# Patient Record
Sex: Female | Born: 1995 | Race: White | Hispanic: No | Marital: Single | Smoking: Never smoker
Health system: Southern US, Community
[De-identification: ages and names within clinical notes are randomized; demographics above are authoritative.]

## PROBLEM LIST (undated history)

## (undated) ENCOUNTER — Inpatient Hospital Stay (HOSPITAL_COMMUNITY): Admission: AD | Payer: Self-pay | Source: Ambulatory Visit | Admitting: Obstetrics & Gynecology

## (undated) DIAGNOSIS — R634 Abnormal weight loss: Secondary | ICD-10-CM

## (undated) DIAGNOSIS — R197 Diarrhea, unspecified: Secondary | ICD-10-CM

## (undated) HISTORY — PX: NO PAST SURGERIES: SHX2092

## (undated) HISTORY — DX: Abnormal weight loss: R63.4

## (undated) HISTORY — DX: Diarrhea, unspecified: R19.7

---
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
## 2014-01-08 IMAGING — US US ABDOMEN COMPLETE
1 series · 14 of 25 positions shown · non-contrast
Comparison: None.

CLINICAL DATA: Diarrhea and weight loss.

COMPLETE ABDOMINAL ULTRASOUND

[Series 1: us abdomen complete · 0.28mm/px · 14 of 72 slices shown]
[im 1/72]
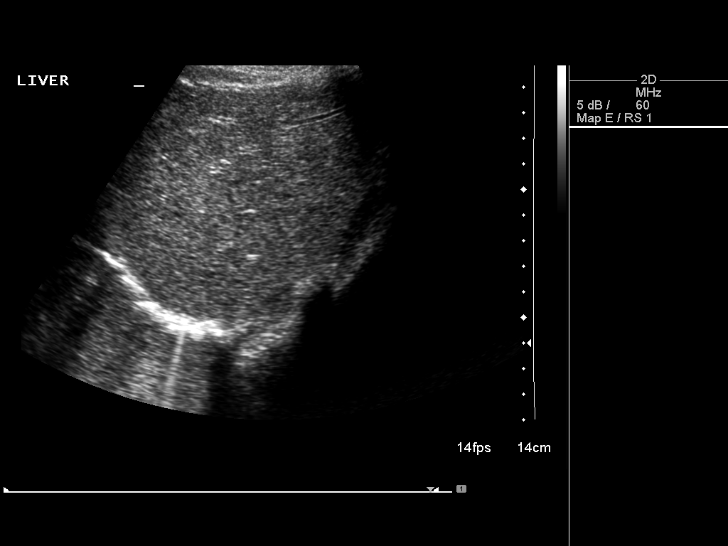
[im 6/72]
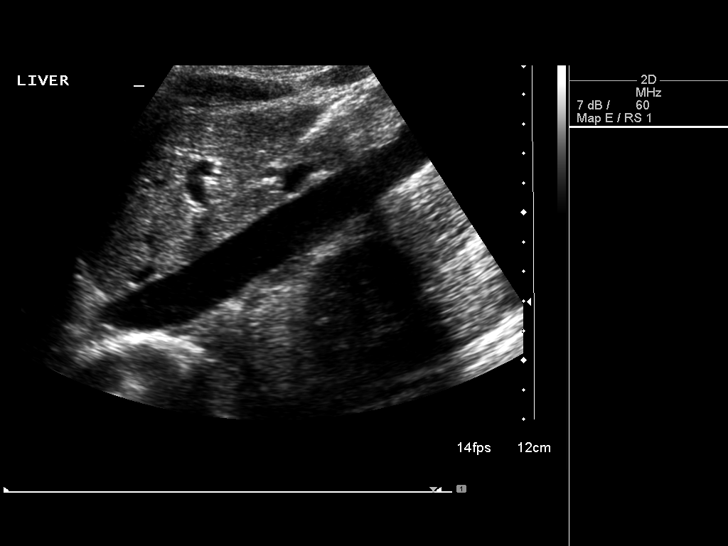
[im 12/72]
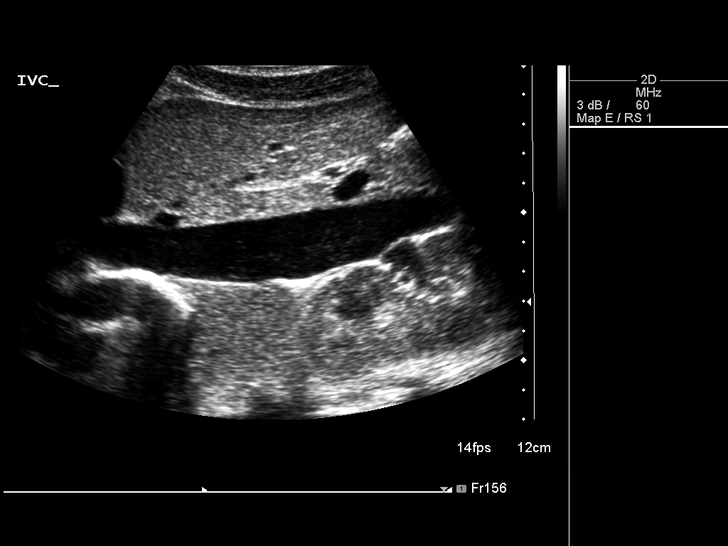
[im 18/72]
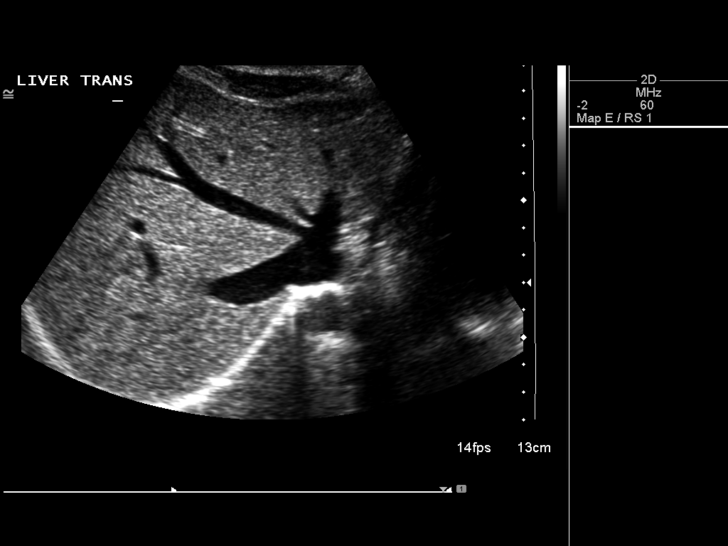
[im 24/72]
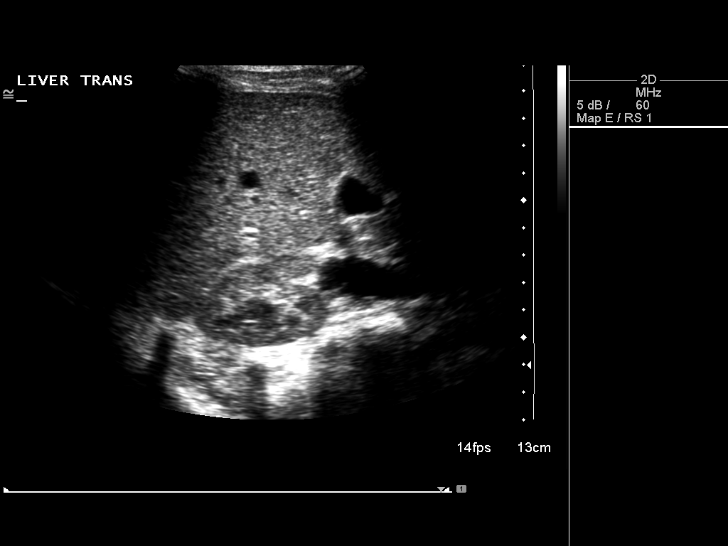
[im 27/72]
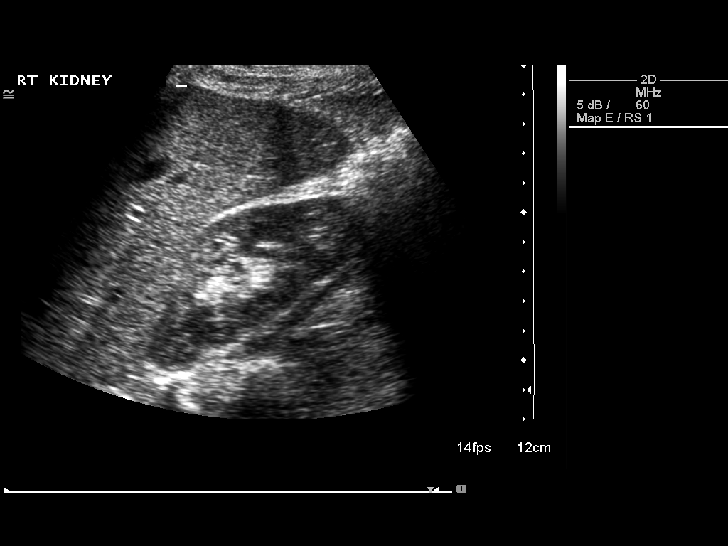
[im 33/72]
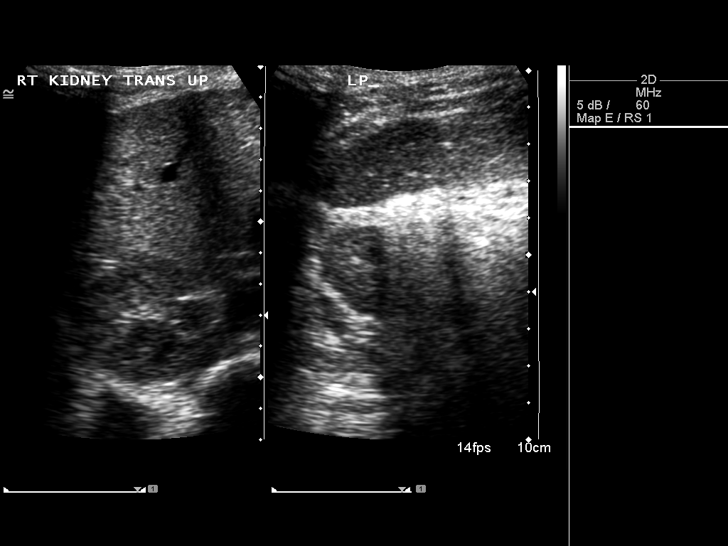
[im 39/72]
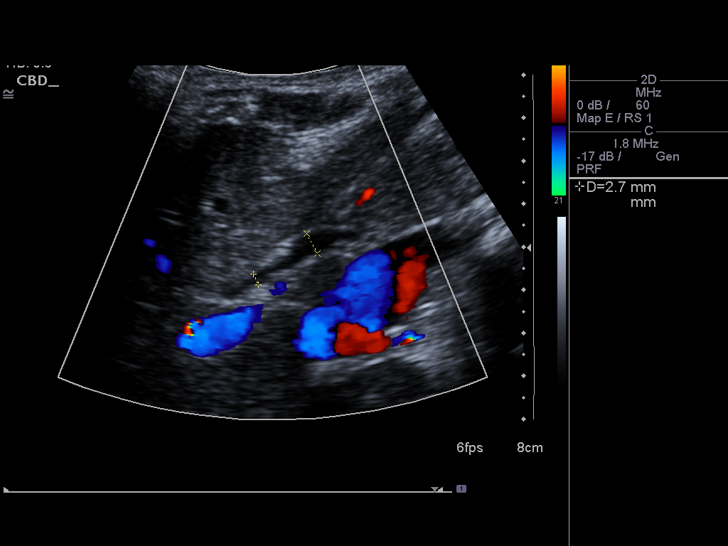
[im 45/72]
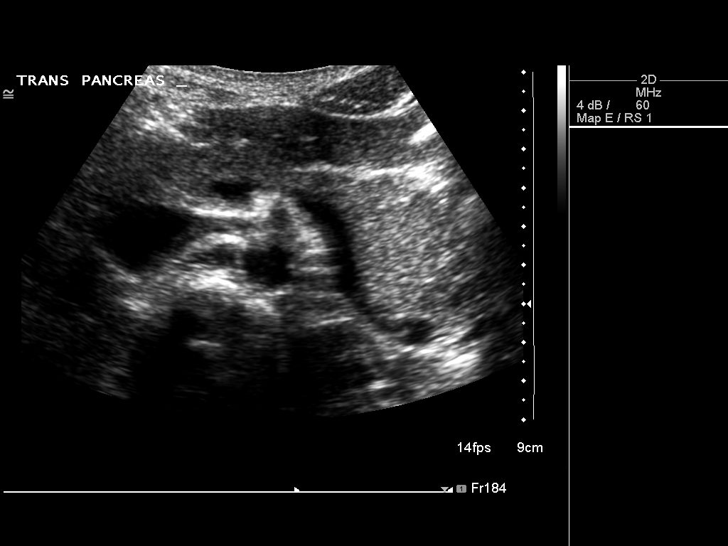
[im 48/72]
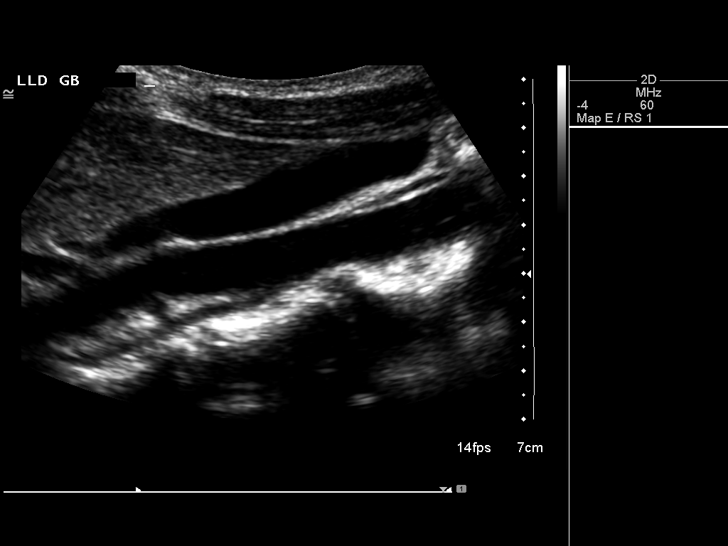
[im 54/72]
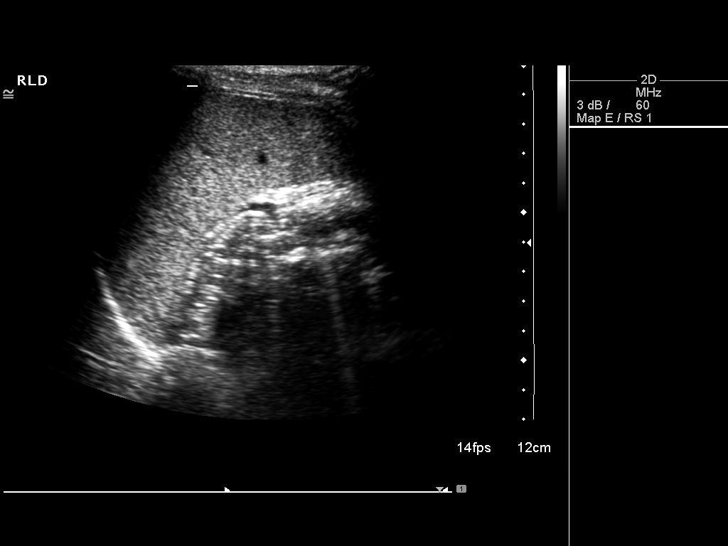
[im 60/72]
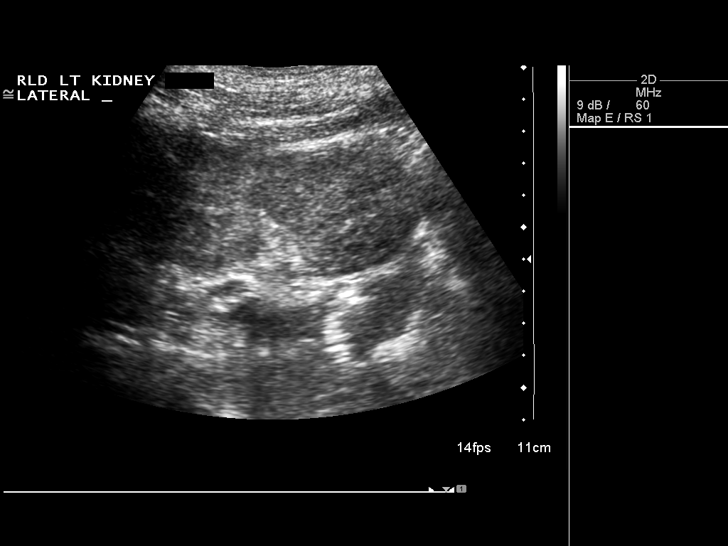
[im 66/72]
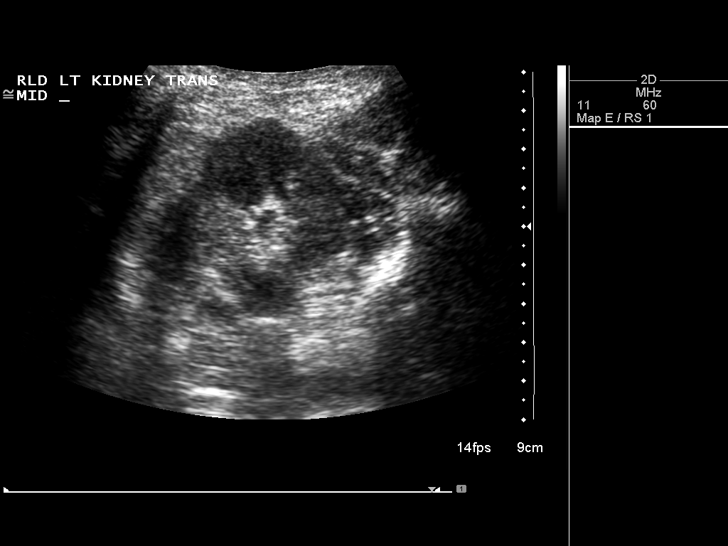
[im 72/72]
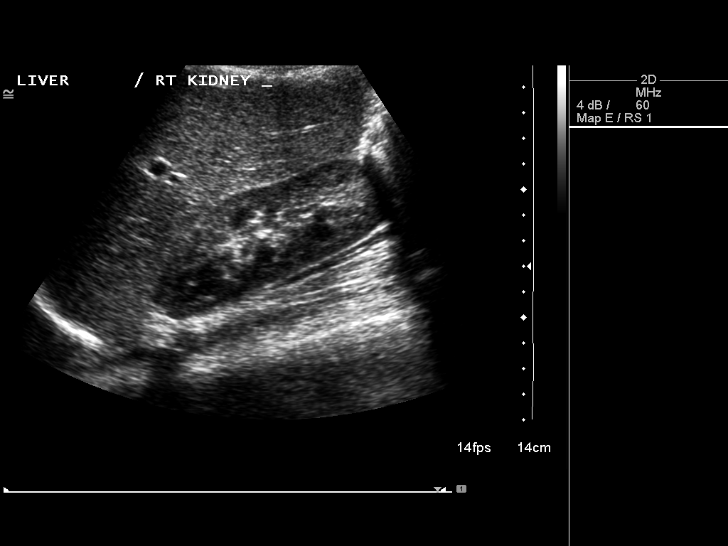

[14 of 25 positions shown; findings below may reference images not displayed]

FINDINGS: Gallbladder:  No gallstones, gallbladder wall thickening, or
pericholecystic fluid.

Common bile duct:  Normal in caliber measuring a maximum of 5.2mm.

Liver:  The liver is sonographically unremarkable.  There is normal
echogenicity without focal lesions or intrahepatic biliary
dilatation.

IVC:  Normal caliber.

Pancreas:  Sonographically normal.

Spleen:  Normal size and echogenicity without focal lesions.

Right Kidney:  9.5 cm in length. Normal renal cortical thickness
and echogenicity without focal lesions or hydronephrosis.

Left Kidney:  10.1 cm in length. Normal renal cortical thickness
and echogenicity without focal lesions or hydronephrosis.

Abdominal aorta:  Normal caliber.
IMPRESSION: Negative abdominal ultrasound.

## 2014-06-13 ENCOUNTER — Ambulatory Visit: Payer: Medicaid Other | Admitting: Family Medicine

## 2014-07-02 ENCOUNTER — Ambulatory Visit: Payer: Medicaid Other | Admitting: Family Medicine

## 2014-07-04 ENCOUNTER — Ambulatory Visit: Payer: Medicaid Other | Admitting: Family Medicine

## 2014-07-06 ENCOUNTER — Ambulatory Visit (INDEPENDENT_AMBULATORY_CARE_PROVIDER_SITE_OTHER): Payer: Medicaid Other | Admitting: Family Medicine

## 2014-07-06 ENCOUNTER — Encounter: Payer: Self-pay | Admitting: Family Medicine

## 2014-07-06 VITALS — BP 102/60 | HR 56 | Temp 98.2°F | Resp 18 | Ht <= 58 in | Wt 86.0 lb

## 2014-07-06 DIAGNOSIS — L739 Follicular disorder, unspecified: Secondary | ICD-10-CM

## 2014-07-06 MED ORDER — SULFAMETHOXAZOLE-TMP DS 800-160 MG PO TABS: 1.0000 | ORAL_TABLET | Freq: Two times a day (BID) | ORAL | Status: AC

## 2014-07-06 NOTE — Patient Instructions (Signed)
Take antibiotics as prescribed Warm sitz baths Avoid shaving F/U as needed  Folliculitis  Folliculitis is redness, soreness, and swelling (inflammation) of the hair follicles. This condition can occur anywhere on the body. People with weakened immune systems, diabetes, or obesity have a greater risk of getting folliculitis. CAUSES  Bacterial infection. This is the most common cause.  Fungal infection.  Viral infection.  Contact with certain chemicals, especially oils and tars. Long-term folliculitis can result from bacteria that live in the nostrils. The bacteria may trigger multiple outbreaks of folliculitis over time. SYMPTOMS Folliculitis most commonly occurs on the scalp, thighs, legs, back, buttocks, and areas where hair is shaved frequently. An early sign of folliculitis is a small, white or yellow, pus-filled, itchy lesion (pustule). These lesions appear on a red, inflamed follicle. They are usually less than 0.2 inches (5 mm) wide. When there is an infection of the follicle that goes deeper, it becomes a boil or furuncle. A group of closely packed boils creates a larger lesion (carbuncle). Carbuncles tend to occur in hairy, sweaty areas of the body. DIAGNOSIS  Your caregiver can usually tell what is wrong by doing a physical exam. A sample may be taken from one of the lesions and tested in a lab. This can help determine what is causing your folliculitis. TREATMENT  Treatment may include:  Applying warm compresses to the affected areas.  Taking antibiotic medicines orally or applying them to the skin.  Draining the lesions if they contain a large amount of pus or fluid.  Laser hair removal for cases of long-lasting folliculitis. This helps to prevent regrowth of the hair. HOME CARE INSTRUCTIONS  Apply warm compresses to the affected areas as directed by your caregiver.  If antibiotics are prescribed, take them as directed. Finish them even if you start to feel better.  You  may take over-the-counter medicines to relieve itching.  Do not shave irritated skin.  Follow up with your caregiver as directed. SEEK IMMEDIATE MEDICAL CARE IF:   You have increasing redness, swelling, or pain in the affected area.  You have a fever. MAKE SURE YOU:  Understand these instructions.  Will watch your condition.  Will get help right away if you are not doing well or get worse. Document Released: 11/02/2001 Document Revised: 02/23/2012 Document Reviewed: 11/24/2011 [facility] Patient Information 2015 [place], [place]. This information is not intended to replace advice given to you by your health care provider. Make sure you discuss any questions you have with your health care provider.

## 2014-07-06 NOTE — Progress Notes (Signed)
Patient ID: Lindsey Watts, female   DOB: 05-Dec-1995, 18 y.o.   MRN: [id number]   Subjective:    Patient ID: Lindsey Watts, female    DOB: 1995-12-08, 18 y.o.   MRN: [id number]  Patient presents for c/o cysts in peri area  patient here with bumps that continue to recur in the vaginal area. She states that she was told she had cyst when she was younger she states that the bumps come up and sometimes draining a bloody yellow fluid then go away, they are tender to touch go away.  she does shave the pubic region if always been on the outer labia majora now she has one on the top of her vagina she is sexually active with one partner she does not use condoms on a regular basis but is on Mirena for birth control. She's not had any vaginal discharge.    Review Of Systems:  GEN- denies fatigue, fever, weight loss,weakness, recent illness HEENT- denies eye drainage, change in vision, nasal discharge, CVS- denies chest pain, palpitations RESP- denies SOB, cough, wheeze ABD- denies N/V, change in stools, abd pain GU- denies dysuria, hematuria, dribbling, incontinence MSK- denies joint pain, muscle aches, injury Neuro- denies headache, dizziness, syncope, seizure activity       Objective:    BP 102/60  Pulse 56  Temp(Src) 98.2 F (36.8 C) (Oral)  Resp 18  Ht 4' 9.25" (1.454 m)  Wt 86 lb (39.009 kg)  BMI 18.45 kg/m2 GEN- NAD, alert and oriented x3 GU- normal external genitalia,2 small erythematous papules 1 on mons pubis with ingrown hair at center, 1 lesions labia minora no pus noted hair at center, no ulceration lesion left labia majora small pustule, vaginal mucosa pink and moist, cervix visualized no growth, no blood form os, minimal thin clear discharge, no CMT, no ovarian masses, uterus normal size        Assessment & Plan:      Problem List Items Addressed This Visit   None    Visit Diagnoses   Folliculitis    -  Primary       Note: This dictation was prepared with  Dragon dictation along with smaller phrase technology. Any transcriptional errors that result from this process are unintentional.

## 2014-09-04 ENCOUNTER — Emergency Department: Payer: Self-pay | Admitting: Internal Medicine

## 2015-03-01 ENCOUNTER — Telehealth: Payer: Self-pay | Admitting: Physician Assistant

## 2015-03-01 NOTE — Telephone Encounter (Signed)
Cancel this message

## 2015-11-18 ENCOUNTER — Encounter: Payer: Self-pay | Admitting: Physician Assistant
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
## 2020-05-20 ENCOUNTER — Encounter: Payer: Self-pay | Admitting: Obstetrics and Gynecology
